# Patient Record
Sex: Female | Born: 1945 | Race: White | Hispanic: No | Marital: Married | Smoking: Never smoker
Health system: Southern US, Community
[De-identification: ages and names within clinical notes are randomized; demographics above are authoritative.]

## PROBLEM LIST (undated history)

## (undated) DIAGNOSIS — E785 Hyperlipidemia, unspecified: Secondary | ICD-10-CM

## (undated) DIAGNOSIS — M109 Gout, unspecified: Secondary | ICD-10-CM

## (undated) DIAGNOSIS — E119 Type 2 diabetes mellitus without complications: Secondary | ICD-10-CM

## (undated) DIAGNOSIS — I10 Essential (primary) hypertension: Secondary | ICD-10-CM

## (undated) HISTORY — PX: TUBAL LIGATION: SHX77

## (undated) HISTORY — DX: Gout, unspecified: M10.9

## (undated) HISTORY — DX: Type 2 diabetes mellitus without complications: E11.9

## (undated) HISTORY — PX: CATARACT EXTRACTION: SUR2

## (undated) HISTORY — DX: Essential (primary) hypertension: I10

## (undated) HISTORY — DX: Hyperlipidemia, unspecified: E78.5

---

## 1998-03-07 ENCOUNTER — Other Ambulatory Visit: Admission: RE | Admit: 1998-03-07 | Discharge: 1998-03-07 | Payer: Self-pay | Admitting: *Deleted

## 2000-01-22 ENCOUNTER — Other Ambulatory Visit: Admission: RE | Admit: 2000-01-22 | Discharge: 2000-01-22 | Payer: Self-pay | Admitting: Family Medicine

## 2002-11-25 ENCOUNTER — Encounter: Payer: Self-pay | Admitting: Family Medicine

## 2002-11-25 ENCOUNTER — Ambulatory Visit (HOSPITAL_COMMUNITY): Admission: RE | Admit: 2002-11-25 | Discharge: 2002-11-25 | Payer: Self-pay | Admitting: Family Medicine

## 2006-09-28 ENCOUNTER — Encounter: Admission: RE | Admit: 2006-09-28 | Discharge: 2006-09-28 | Payer: Self-pay | Admitting: Family Medicine

## 2009-02-13 ENCOUNTER — Encounter: Admission: RE | Admit: 2009-02-13 | Discharge: 2009-02-13 | Payer: Self-pay | Admitting: Family Medicine

## 2012-02-07 ENCOUNTER — Other Ambulatory Visit: Payer: Self-pay | Admitting: Family Medicine

## 2012-02-07 DIAGNOSIS — M79602 Pain in left arm: Secondary | ICD-10-CM

## 2012-02-07 DIAGNOSIS — M25512 Pain in left shoulder: Secondary | ICD-10-CM

## 2012-02-11 ENCOUNTER — Ambulatory Visit
Admission: RE | Admit: 2012-02-11 | Discharge: 2012-02-11 | Disposition: A | Discharge status: Home / Self Care | Payer: Medicare Other | Source: Ambulatory Visit | Attending: Family Medicine | Admitting: Family Medicine

## 2012-02-11 ENCOUNTER — Other Ambulatory Visit: Payer: Self-pay

## 2012-02-11 ENCOUNTER — Other Ambulatory Visit: Payer: Self-pay | Admitting: Family Medicine

## 2012-02-11 DIAGNOSIS — M25512 Pain in left shoulder: Secondary | ICD-10-CM

## 2012-02-11 DIAGNOSIS — M79602 Pain in left arm: Secondary | ICD-10-CM

## 2012-08-14 ENCOUNTER — Ambulatory Visit (HOSPITAL_COMMUNITY)
Admission: RE | Admit: 2012-08-14 | Discharge: 2012-08-14 | Disposition: A | Discharge status: Home / Self Care | Payer: Medicare PPO | Source: Ambulatory Visit | Attending: Family Medicine | Admitting: Family Medicine

## 2012-08-14 DIAGNOSIS — M79609 Pain in unspecified limb: Secondary | ICD-10-CM | POA: Insufficient documentation

## 2012-08-14 DIAGNOSIS — M25562 Pain in left knee: Secondary | ICD-10-CM

## 2012-08-16 ENCOUNTER — Other Ambulatory Visit (HOSPITAL_COMMUNITY): Payer: Self-pay | Admitting: Family Medicine

## 2012-08-16 DIAGNOSIS — M25562 Pain in left knee: Secondary | ICD-10-CM

## 2013-12-15 ENCOUNTER — Ambulatory Visit
Admission: RE | Admit: 2013-12-15 | Discharge: 2013-12-15 | Disposition: A | Discharge status: Home / Self Care | Payer: Medicare PPO | Source: Ambulatory Visit | Attending: Family Medicine | Admitting: Family Medicine

## 2013-12-15 ENCOUNTER — Other Ambulatory Visit: Payer: Self-pay | Admitting: Family Medicine

## 2013-12-15 DIAGNOSIS — R52 Pain, unspecified: Secondary | ICD-10-CM

## 2014-12-22 ENCOUNTER — Other Ambulatory Visit: Payer: Self-pay | Admitting: Family Medicine

## 2014-12-22 DIAGNOSIS — M5136 Other intervertebral disc degeneration, lumbar region: Secondary | ICD-10-CM

## 2015-01-10 ENCOUNTER — Other Ambulatory Visit: Payer: Medicare PPO

## 2015-01-17 ENCOUNTER — Ambulatory Visit
Admission: RE | Admit: 2015-01-17 | Discharge: 2015-01-17 | Disposition: A | Discharge status: Home / Self Care | Payer: Medicare PPO | Source: Ambulatory Visit | Attending: Family Medicine | Admitting: Family Medicine

## 2015-01-17 DIAGNOSIS — M5136 Other intervertebral disc degeneration, lumbar region: Secondary | ICD-10-CM

## 2020-06-20 ENCOUNTER — Other Ambulatory Visit: Payer: Self-pay | Admitting: Neurosurgery

## 2020-06-20 DIAGNOSIS — M4807 Spinal stenosis, lumbosacral region: Secondary | ICD-10-CM

## 2020-07-13 ENCOUNTER — Ambulatory Visit
Admission: RE | Admit: 2020-07-13 | Discharge: 2020-07-13 | Disposition: A | Discharge status: Home / Self Care | Payer: Medicare PPO | Source: Ambulatory Visit | Attending: Neurosurgery | Admitting: Neurosurgery

## 2020-07-13 ENCOUNTER — Other Ambulatory Visit: Payer: Self-pay

## 2020-07-13 DIAGNOSIS — M4807 Spinal stenosis, lumbosacral region: Secondary | ICD-10-CM

## 2020-08-14 ENCOUNTER — Other Ambulatory Visit: Payer: Self-pay | Admitting: Neurosurgery

## 2020-08-14 DIAGNOSIS — M4804 Spinal stenosis, thoracic region: Secondary | ICD-10-CM

## 2020-09-21 ENCOUNTER — Other Ambulatory Visit: Payer: Medicare PPO

## 2020-10-19 ENCOUNTER — Other Ambulatory Visit: Payer: Self-pay

## 2020-10-19 ENCOUNTER — Ambulatory Visit
Admission: RE | Admit: 2020-10-19 | Discharge: 2020-10-19 | Disposition: A | Discharge status: Home / Self Care | Payer: Medicare PPO | Source: Ambulatory Visit | Attending: Neurosurgery | Admitting: Neurosurgery

## 2020-10-19 DIAGNOSIS — M4804 Spinal stenosis, thoracic region: Secondary | ICD-10-CM

## 2021-06-18 IMAGING — MR MR LUMBAR SPINE W/O CM
4 of 5 series · 18 of 48 positions shown · non-contrast
Comparison: 01/17/2015

CLINICAL DATA: Low back pain for many years which is worsening.

EXAM:
MRI LUMBAR SPINE WITHOUT CONTRAST
TECHNIQUE: Multiplanar, multisequence MR imaging of the lumbar spine was
performed. No intravenous contrast was administered.

[Series 5: T2 · sagittal · 4.0mm · 0.73mm/px · 6 of 15 slices shown (1 of 2)]
[im 1/15]
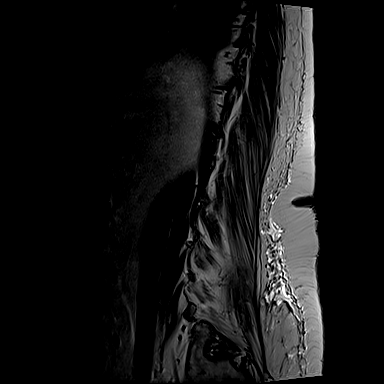
[im 3/15]
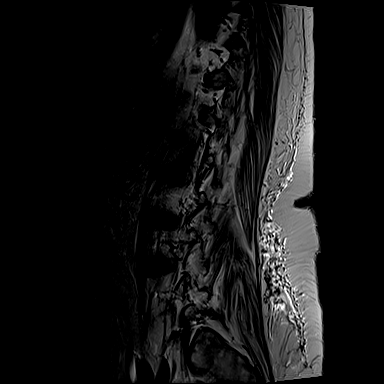
[im 6/15]
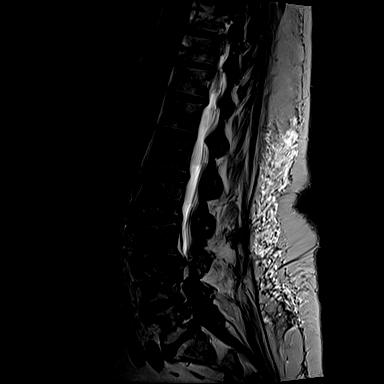
[im 9/15]
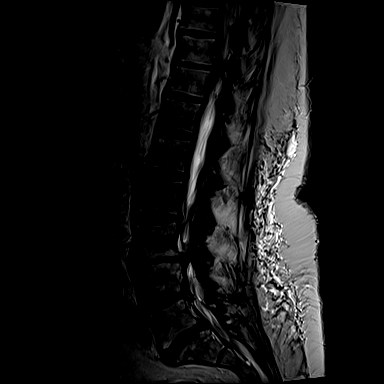
[im 12/15]
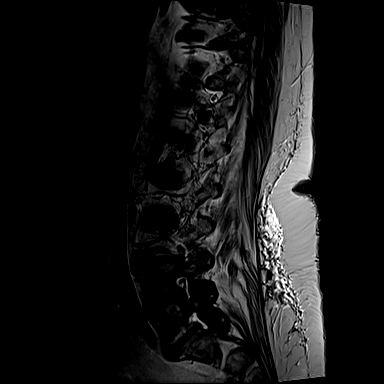
[im 15/15]
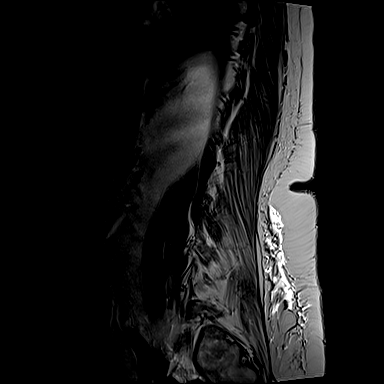

[Series 6: T1 · sagittal · 4.0mm · 0.88mm/px · 3 of 15 slices shown (1 of 2)]
[im 3/15]
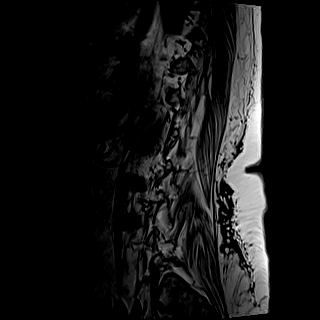
[im 9/15]
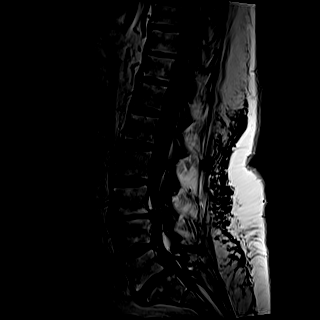
[im 15/15]
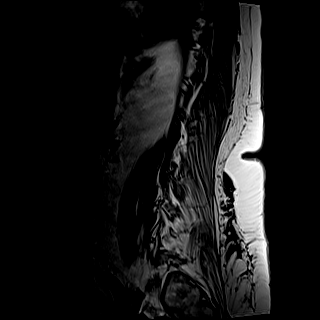

[Series 10: T1 · axial · 4.0mm · 0.28mm/px · z∈[-41,+89]mm · 3 of 39 slices shown (2 of 2)]
[im 6/39]
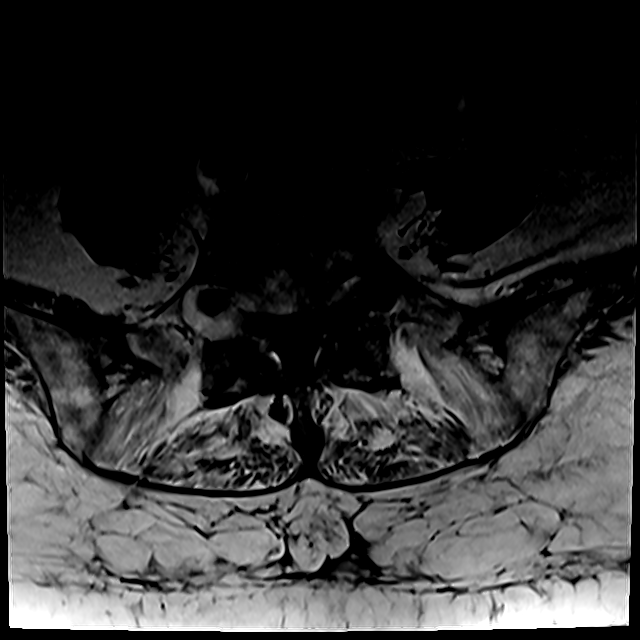
[im 20/39]
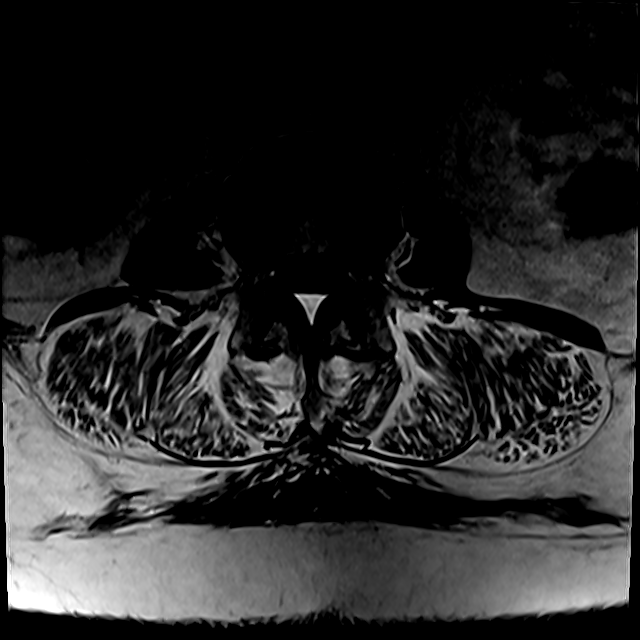
[im 33/39]
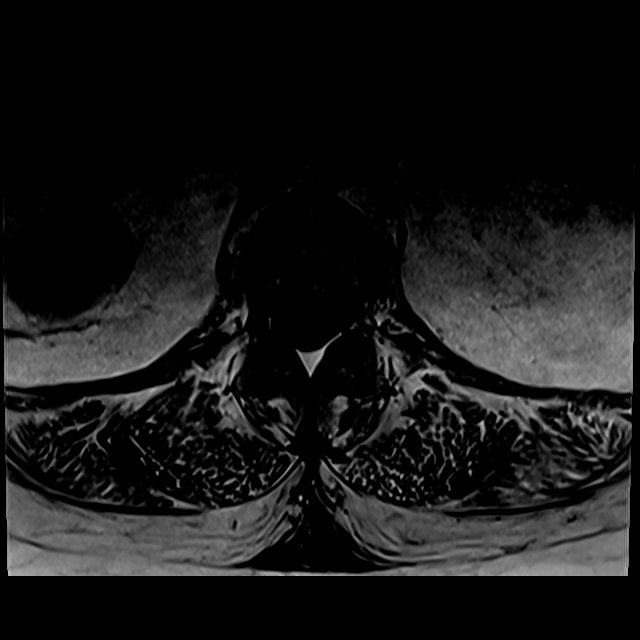

[Series 13: T2 · axial · 4.0mm · 0.28mm/px · z∈[-66,+89]mm · 6 of 39 slices shown (2 of 2)]
[im 1/39]
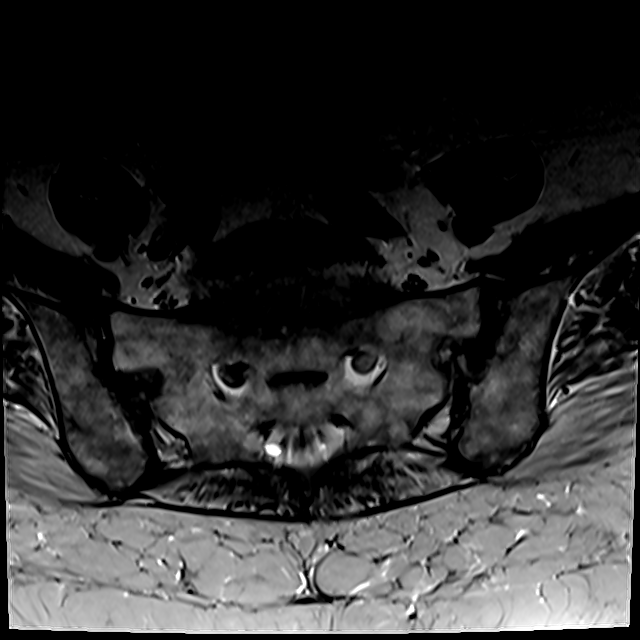
[im 6/39]
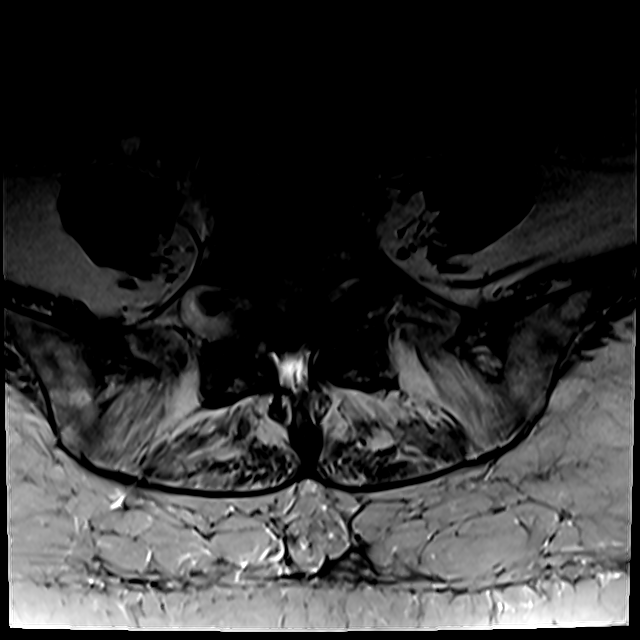
[im 11/39]
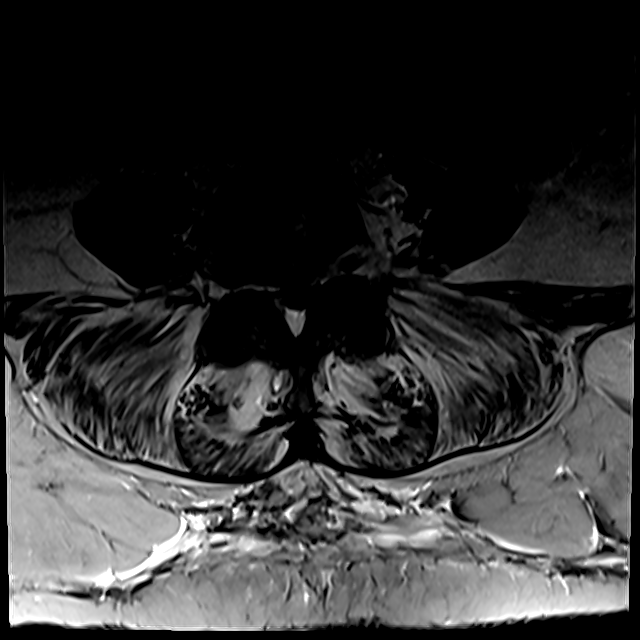
[im 17/39]
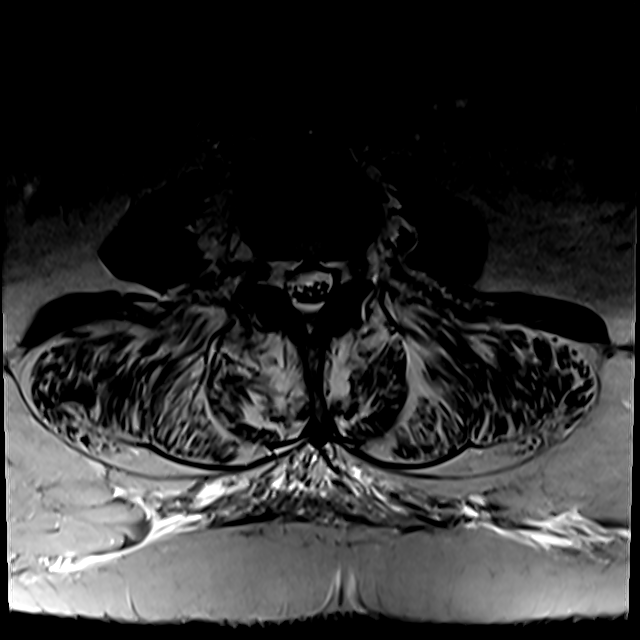
[im 20/39]
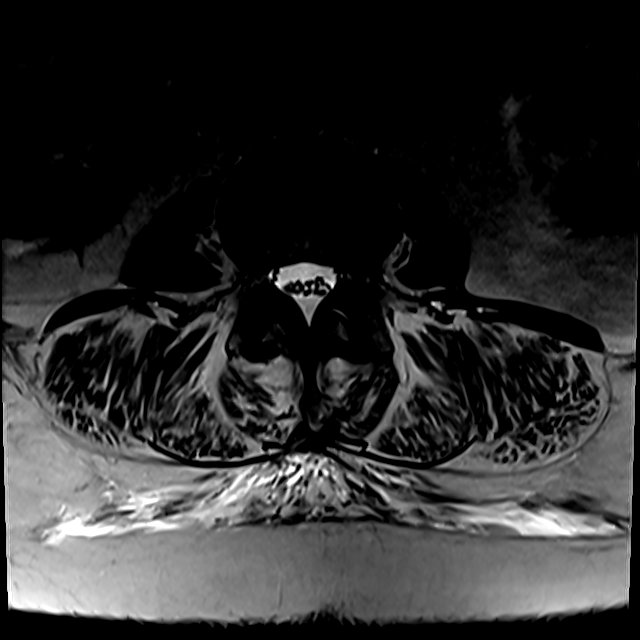
[im 33/39]
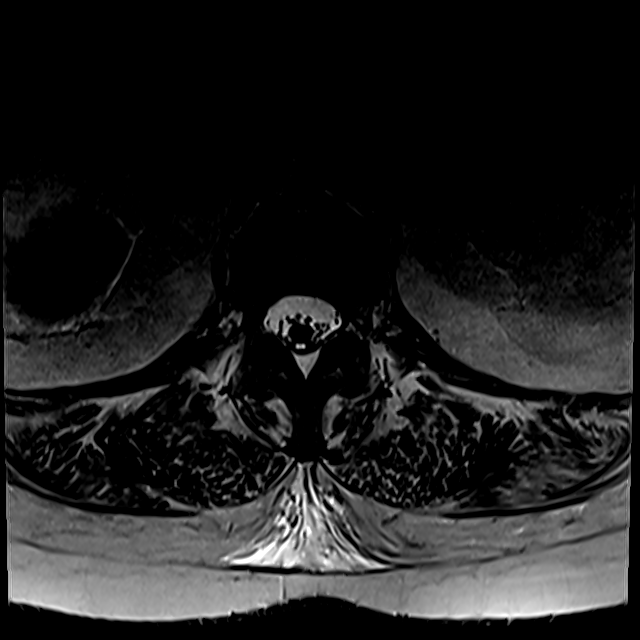

[18 of 48 positions shown; findings below may reference images not displayed]

FINDINGS: Segmentation:  5 lumbar type vertebral bodies.

Alignment:  No malalignment.

Vertebrae:  No fracture or primary bone lesion.

Conus medullaris and cauda equina: Conus extends to the L1 level.
Conus and cauda equina appear normal.

Paraspinal and other soft tissues: Negative except for fatty atrophy
of the para spinous musculature.

Disc levels:

T9-10: Not studied in the axial plane. Mild bulging of the disc.
Facet and ligamentous hypertrophy. Some canal narrowing but no
apparent compressive effect upon the cord.

T10-11: Not studied in the axial plane. Shallow protrusion of the
disc. Pronounced facet and ligamentous hypertrophy. Stenosis of the
canal with some deformity of the cord. Question abnormal T2 signal
in the cord at this level. Findings could possibly relate to lower
thoracic myelopathy. Consider complete thoracic spine evaluation.

T11-12: Not studied in the axial plane. Bulging of the disc. Mild
facet and ligamentous hypertrophy. No compressive stenosis.

T12-L1: Minimal disc bulge. Mild facet and ligamentous hypertrophy.
No compressive stenosis.

L1-2: Mild bulging of the disc. Mild facet hypertrophy. No
compressive stenosis.

L2-3: Mild bulging of the disc. Mild facet hypertrophy. No
compressive stenosis.

L3-4: Chronic disc herniation more prominent towards the right.
Associated endplate osteophytes. Facet and ligamentous hypertrophy.
Multifactorial spinal stenosis, worse on the right than the left.
Neural compression could occur at this level. Findings have worsened
slightly since 0784.

L4-5: Chronic broad-based disc herniation. Associated endplate
osteophytes. Facet and ligamentous hypertrophy. Severe
multifactorial stenosis. Similar appearance to the study of 0784.

L5-S1: Shallow protrusion of the disc. Facet and ligamentous
hypertrophy. No central canal stenosis. No S1 nerve compression is
seen. Left foraminal narrowing that could possibly affect the
exiting L5 nerve. Similar appearance to the study of 0784.
IMPRESSION: 1. T10-11: Not studied in the axial plane. Shallow protrusion of the
disc. Pronounced facet and ligamentous hypertrophy. Spinal stenosis
with some deformity of the cord. Question abnormal T2 signal in the
cord at this level. Findings could possibly relate to lower thoracic
myelopathy. Consider complete thoracic spine evaluation.
2. L3-4: Multifactorial spinal stenosis, worse on the right than the
left. Neural compression could occur at this level, particularly on
the right. Findings have worsened slightly since [DATE]. L4-5: Severe multifactorial stenosis unchanged since [DATE]. L5-S1: Left foraminal stenosis because of osteophyte and bulging
disc material that could possibly affect the exiting L5 nerve,
similar to the study of 0784.

## 2022-07-29 ENCOUNTER — Ambulatory Visit: Payer: Medicare PPO | Admitting: Cardiology

## 2022-08-04 ENCOUNTER — Ambulatory Visit: Payer: Medicare PPO | Admitting: Cardiology

## 2022-08-07 ENCOUNTER — Encounter: Payer: Self-pay | Admitting: Cardiology

## 2022-08-07 ENCOUNTER — Ambulatory Visit: Payer: Medicare PPO | Admitting: Cardiology

## 2022-08-10 NOTE — Progress Notes (Deleted)
    Patient referred by Leonard Downing, * for ***  Subjective:   Carolyn Irwin, female    DOB: October 28, 1945, 77 y.o.   MRN: [id number]  *** No chief complaint on file.   *** HPI  77 y.o.  female with PMH significant for but not limited to Diabetes Mellitus, HTN, and hyperlipidemia who presents today to establish care and evaluation of SVT/Afib.   Pt was sent for evaluation from PCP after obtaining event monitor results (as below).  *** No past medical history on file.  *** *** The histories are not reviewed yet. Please review them in the "History" navigator section and refresh this SmartLink.  *** Social History   Tobacco Use  Smoking Status Not on file  Smokeless Tobacco Not on file    Social History   Substance and Sexual Activity  Alcohol Use Not on file    *** No family history on file.  *** No current outpatient medications on file.   Cardiovascular and other pertinent studies:  07/24/2022-07/30/2022   Reviewed external labs and tests, independently interpreted  *** EKG ***/***/202***: ***  ***  *** Recent labs: 07/24/2022: Glucose 124, BUN/Cr 16/1.22. EGFR 45. Na/K 141/4.2.  H/H 13.6/39.8. MCV 89. Platelets 209 ***HbA1C ***% Chol ***, TG ***, HDL ***, LDL *** TSH 1.660 normal Mag: 1.6  *** ROS      *** There were no vitals filed for this visit.   There is no height or weight on file to calculate BMI. There were no vitals filed for this visit.  *** Objective:   Physical Exam   Afib/SVT    CHA2DS2-VASc Score =  5 The patient's score is based upon: Age greater than or equal to 41 (2), Female (1), HTN hx (1), DM Hx (1)           Visit diagnoses: No diagnosis found.   No orders of the defined types were placed in this encounter.    Medication changes this visit: There are no discontinued medications.  No orders of the defined types were placed in this encounter.    Assessment & Recommendations:    ***  ***  Thank you for referring the patient to [place]. Please feel free to contact with any questions.   Nigel Mormon, MD Pager: [phone] Office: [phone]

## 2022-08-11 ENCOUNTER — Ambulatory Visit: Payer: Medicare PPO | Admitting: Cardiology

## 2022-08-12 ENCOUNTER — Ambulatory Visit: Payer: Medicare PPO | Admitting: Cardiology

## 2022-08-14 ENCOUNTER — Encounter: Payer: Self-pay | Admitting: Cardiology

## 2022-08-14 ENCOUNTER — Ambulatory Visit: Payer: Medicare PPO | Admitting: Cardiology

## 2022-08-14 VITALS — BP 129/67 | HR 95 | Resp 18 | Ht 62.0 in | Wt 228.2 lb

## 2022-08-14 DIAGNOSIS — I10 Essential (primary) hypertension: Secondary | ICD-10-CM

## 2022-08-14 DIAGNOSIS — I480 Paroxysmal atrial fibrillation: Secondary | ICD-10-CM

## 2022-08-14 DIAGNOSIS — E119 Type 2 diabetes mellitus without complications: Secondary | ICD-10-CM

## 2022-08-14 DIAGNOSIS — E782 Mixed hyperlipidemia: Secondary | ICD-10-CM

## 2022-08-14 DIAGNOSIS — R0609 Other forms of dyspnea: Secondary | ICD-10-CM

## 2022-08-14 DIAGNOSIS — Z7901 Long term (current) use of anticoagulants: Secondary | ICD-10-CM

## 2022-08-14 MED ORDER — APIXABAN 5 MG PO TABS: 5.0000 mg | ORAL_TABLET | Freq: Two times a day (BID) | ORAL | 0 refills | Status: AC

## 2022-08-14 NOTE — Progress Notes (Signed)
ID:  Carolyn Irwin, DOB September 02, 1945, MRN [id number]  PCP:  Leonard Downing, MD  Cardiologist:  Rex Kras, DO, [facility] (established care 08/14/2022) Former Cardiology Providers: none  REASON FOR CONSULT: Evaluation of SVT/Afib   REQUESTING PHYSICIAN:  Leonard Downing, MD [address] [place],  [place] [phone]  Chief Complaint  Patient presents with   SVT   Atrial Fibrillation   New Patient (Initial Visit)    HPI  Carolyn Irwin is a 77 y.o.  female who presents to the clinic for evaluation of SVT/Afib at the request of Leonard Downing, *. Her past medical history and cardiovascular risk factors include: hypertension, hyperlipidemia, and diabetes mellitus  Patient was seen by PCP for dizziness and lightheadedness that was occurring for three days. She was placed on an event monitor.  Monitor results were concerning for atrial fibrillation and she referred to cardiology for further evaluation and management.  She is accompanied by both her daughter and husband at today's office visit.   No prior history of atrial fibrillation.  No prior history of she does ambulate with a cane to prevent falls.    Denies anginal chest pain or heart failure symptoms.  However, review of systems are positive for shortness of breath, predominantly with effort related activities, ongoing for the last several years, no change in intensity/frequency/duration.  She was started on vertigo medications given her lightheaded and dizziness which has helped her symptoms overall.  FUNCTIONAL STATUS: No structured exercise program or daily routine.  ALLERGIES: Not on File  MEDICATION LIST PRIOR TO VISIT: Current Meds  Medication Sig   allopurinol (ZYLOPRIM) 100 MG tablet Take 100 mg by mouth daily.   apixaban (ELIQUIS) 5 MG TABS tablet Take 1 tablet (5 mg total) by mouth 2 (two) times daily.   atorvastatin (LIPITOR) 80 MG tablet Take 80 mg by mouth daily.   Calcium Acetate, Phos  Binder, (CALCIUM ACETATE PO) Take by mouth.   diltiazem (CARDIZEM CD) 120 MG 24 hr capsule Take 120 mg by mouth daily.   furosemide (LASIX) 40 MG tablet Take 40 mg by mouth daily.   ipratropium (ATROVENT) 0.06 % nasal spray Place 1 spray into both nostrils as needed for rhinitis.   loratadine (CLARITIN) 10 MG tablet Take 10 mg by mouth daily as needed.   losartan (COZAAR) 100 MG tablet Take 50 mg by mouth daily.   meclizine (ANTIVERT) 25 MG tablet Take 25 mg by mouth daily.   Melatonin 1 MG CAPS Take by mouth.   metFORMIN (GLUCOPHAGE) 1000 MG tablet Take 1,000 mg by mouth 2 (two) times daily with a meal.   Multiple Vitamins-Minerals (MULTIVITAMIN WITH MINERALS) tablet Take 1 tablet by mouth daily.     PAST MEDICAL HISTORY: Past Medical History:  Diagnosis Date   Diabetes mellitus without complication ([place])    Gout    Hyperlipidemia    Hypertension     PAST SURGICAL HISTORY: Past Surgical History:  Procedure Laterality Date   CATARACT EXTRACTION Bilateral    TUBAL LIGATION Bilateral     FAMILY HISTORY: The patient family history includes Parkinson's disease in her sister; Prostate cancer in her father.  SOCIAL HISTORY:  The patient  reports that she has never smoked. She has never used smokeless tobacco. She reports that she does not drink alcohol and does not use drugs.  REVIEW OF SYSTEMS: Review of Systems  Cardiovascular:  Positive for dyspnea on exertion. Negative for chest pain, claudication, irregular heartbeat, leg  swelling, near-syncope, orthopnea, palpitations, paroxysmal nocturnal dyspnea and syncope.  Respiratory:  Positive for shortness of breath.   Hematologic/Lymphatic: Negative for bleeding problem.  Musculoskeletal:  Negative for muscle cramps and myalgias.  Neurological:  Positive for dizziness (Intermittent, improving) and light-headedness (Intermittent, improving).    PHYSICAL EXAM:    08/14/2022    8:52 AM  Vitals with BMI  Height 5\' 2"   Weight 228  lbs 3 oz  BMI [id number]  Systolic [id number]  Diastolic 67  Pulse 95    Physical Exam  Constitutional: No distress.  Age appropriate, hemodynamically stable, ambulates with a cane  Neck: No JVD present.  Cardiovascular: Normal rate, regular rhythm, S1 normal, S2 normal, intact distal pulses and normal pulses. Exam reveals no gallop, no S3 and no S4.  No murmur heard. Pulmonary/Chest: Effort normal and breath sounds normal. No stridor. She has no wheezes. She has no rales.  Abdominal: Soft. Bowel sounds are normal. She exhibits no distension. There is no abdominal tenderness.  Abdominal obesity  Musculoskeletal:        General: No edema.     Cervical back: Neck supple.  Neurological: She is alert and oriented to person, place, and time. She has intact cranial nerves (2-12).  Skin: Skin is warm and dry.  Denies skin, poor nail hygiene   CARDIAC DATABASE: EKG: 08/14/2022: Sinus rhythm, 85 bpm, LAE, consider old inferior infarct, without underlying injury pattern.  Echocardiogram: No results found for this or any previous visit from the past 1095 days.    Stress Testing: No results found for this or any previous visit from the past 1095 days.   Heart Catheterization: None  Cardiac Monitor  07/04/2022 - 07/28/2022 Will get entire report from PCP office  Day summaries provided notes paroxysmal Afib.    LABORATORY DATA:     No data to display              No data to display          Lipid Panel  No results found for: "CHOL", "TRIG", "HDL", "CHOLHDL", "VLDL", "LDLCALC", "LDLDIRECT", "LABVLDL"  No components found for: "NTPROBNP" No results for input(s): "PROBNP" in the last 8760 hours.  External Labs 07/24/2022 Glucose 124, BUN/Cr 16/1.22. EGFR 45. Na/K 141/4.2. Rest of the CMP normal H/H 13.6/39.8. MCV 89. Platelets 209 Mag 1.6 TSH 1.66  HEMOGLOBIN A1C No results found for: "HGBA1C", "MPG"  IMPRESSION:    ICD-10-CM   1. PAF (paroxysmal atrial fibrillation)  (HCC)  I48.0 EKG 12-Lead    apixaban (ELIQUIS) 5 MG TABS tablet    Basic Metabolic Panel (BMET)    Hemoglobin and hematocrit, blood    PCV ECHOCARDIOGRAM COMPLETE    Hemoglobin and hematocrit, blood    Basic Metabolic Panel (BMET)    2. Long term (current) use of anticoagulants  Z79.01     3. DOE (dyspnea on exertion)  R06.09 PCV ECHOCARDIOGRAM COMPLETE    4. Benign hypertension  I10     5. Mixed hyperlipidemia  E78.2     6. Non-insulin dependent type 2 diabetes mellitus ([place])  E11.9     7. Class 3 severe obesity due to excess calories with serious comorbidity and body mass index (BMI) of 40.0 to 44.9 in adult [facility])  E66.01    Z68.41        RECOMMENDATIONS: Carolyn Irwin is a 77 y.o.  female whose past medical history and cardiac risk factors include: hypertension, hyperlipidemia, and diabetes mellitus  PAF (paroxysmal atrial fibrillation) (  [place]) Long-term anticoagulation Personally reviewed the fragmented report monitor provided by PCP which notes evidence of PAF Will obtain an entire report she is more legible to read.  Based on the data provided her overall PAF burden appears to be low but still present.  EKG personally reviewed, NSR today  CHA2DS2-VASc Score =   5 -  Age (2), HTN (1), DM (1), Female (1) Rate: Diltiazem Rhythm: None Anti-coagulation:Starting Eliquis 5 mg BID Repeat H/H and BMP in 6 months No prior history of intracranial bleeding or gastrointestinal bleeding. Discussed the risks, benefits, alternatives to anticoagulation with the patient, husband, and daughter.   Dyspnea on Exertion  Chronic-ongoing for years. Predominantly brought on by effort related activities. Appears euvolemic on physical examination. Home blood pressures appear to be well-controlled on current medical therapy. Echo will be ordered to evaluate for structural heart disease and left ventricular systolic function. Will discuss ischemic workup at the next visit.  Benign essential  hypertension:  Office blood pressures are well-controlled. Medications reconciled. No changes warranted.  Diabetes mellitus type 2, non-insulin-dependent. Currently on atorvastatin, metformin. Currently managed by primary care provider.  Mixed hyperlipidemia: Currently on statin therapy.  Reemphasized importance of lipid management.    FINAL MEDICATION LIST END OF ENCOUNTER: Meds ordered this encounter  Medications   apixaban (ELIQUIS) 5 MG TABS tablet    Sig: Take 1 tablet (5 mg total) by mouth 2 (two) times daily.    Dispense:  180 tablet    Refill:  0    There are no discontinued medications.   Current Outpatient Medications:    allopurinol (ZYLOPRIM) 100 MG tablet, Take 100 mg by mouth daily., Disp: , Rfl:    apixaban (ELIQUIS) 5 MG TABS tablet, Take 1 tablet (5 mg total) by mouth 2 (two) times daily., Disp: 180 tablet, Rfl: 0   atorvastatin (LIPITOR) 80 MG tablet, Take 80 mg by mouth daily., Disp: , Rfl:    Calcium Acetate, Phos Binder, (CALCIUM ACETATE PO), Take by mouth., Disp: , Rfl:    diltiazem (CARDIZEM CD) 120 MG 24 hr capsule, Take 120 mg by mouth daily., Disp: , Rfl:    furosemide (LASIX) 40 MG tablet, Take 40 mg by mouth daily., Disp: , Rfl:    ipratropium (ATROVENT) 0.06 % nasal spray, Place 1 spray into both nostrils as needed for rhinitis., Disp: , Rfl:    loratadine (CLARITIN) 10 MG tablet, Take 10 mg by mouth daily as needed., Disp: , Rfl:    losartan (COZAAR) 100 MG tablet, Take 50 mg by mouth daily., Disp: , Rfl:    meclizine (ANTIVERT) 25 MG tablet, Take 25 mg by mouth daily., Disp: , Rfl:    Melatonin 1 MG CAPS, Take by mouth., Disp: , Rfl:    metFORMIN (GLUCOPHAGE) 1000 MG tablet, Take 1,000 mg by mouth 2 (two) times daily with a meal., Disp: , Rfl:    Multiple Vitamins-Minerals (MULTIVITAMIN WITH MINERALS) tablet, Take 1 tablet by mouth daily., Disp: , Rfl:   Orders Placed This Encounter  Procedures   Basic Metabolic Panel (BMET)   Hemoglobin and  hematocrit, blood   EKG 12-Lead   PCV ECHOCARDIOGRAM COMPLETE    There are no Patient Instructions on file for this visit.   --Continue cardiac medications as reconciled in final medication list. --Return in about 6 weeks (around 09/25/2022) for Follow up, A. fib, Review test results. or sooner if needed. --Continue follow-up with your primary care physician regarding the management of your other chronic comorbid  conditions.  Patient's questions and concerns were addressed to her satisfaction. She voices understanding of the instructions provided during this encounter.   This note was created using a voice recognition software as a result there may be grammatical errors inadvertently enclosed that do not reflect the nature of this encounter. Every attempt is made to correct such errors.  Rex Kras, [place], [facility]  Pager:  [phone] Office: [phone]

## 2022-08-27 ENCOUNTER — Ambulatory Visit: Payer: Medicare PPO

## 2022-08-27 DIAGNOSIS — I480 Paroxysmal atrial fibrillation: Secondary | ICD-10-CM

## 2022-08-27 DIAGNOSIS — R0609 Other forms of dyspnea: Secondary | ICD-10-CM

## 2022-09-09 NOTE — Progress Notes (Signed)
Called patient to inform her about her echo patient understood

## 2022-09-29 ENCOUNTER — Encounter: Payer: Self-pay | Admitting: Cardiology

## 2022-09-29 ENCOUNTER — Ambulatory Visit: Payer: Medicare PPO | Admitting: Cardiology

## 2022-09-29 VITALS — BP 144/71 | HR 97 | Resp 20 | Ht 62.0 in | Wt 223.6 lb

## 2022-09-29 DIAGNOSIS — R0609 Other forms of dyspnea: Secondary | ICD-10-CM

## 2022-09-29 DIAGNOSIS — E782 Mixed hyperlipidemia: Secondary | ICD-10-CM

## 2022-09-29 DIAGNOSIS — I10 Essential (primary) hypertension: Secondary | ICD-10-CM

## 2022-09-29 DIAGNOSIS — I480 Paroxysmal atrial fibrillation: Secondary | ICD-10-CM

## 2022-09-29 DIAGNOSIS — E66813 Obesity, class 3: Secondary | ICD-10-CM

## 2022-09-29 DIAGNOSIS — E119 Type 2 diabetes mellitus without complications: Secondary | ICD-10-CM

## 2022-09-29 DIAGNOSIS — Z7901 Long term (current) use of anticoagulants: Secondary | ICD-10-CM

## 2022-09-29 NOTE — Progress Notes (Signed)
ID:  Carolyn Irwin, DOB 07-25-1945, MRN [id number]  PCP:  Kaleen Mask, MD  Cardiologist:  Tessa Lerner, DO, [facility] (established care 08/14/2022) Former Cardiology Providers: none  Date: 09/29/22 Last Office Visit: 08/14/2022  Chief Complaint  Patient presents with   Atrial Fibrillation   Follow-up    HPI  Carolyn Irwin is a 77 y.o.  female whose past medical history and cardiovascular risk factors include: Paroxysmal atrial fibrillation, hypertension, hyperlipidemia, and diabetes mellitus  Patient was referred to the practice in March 2024 for evaluation and management of paroxysmal atrial fibrillation.  In the past she was being seen by her PCP for dizziness and lightheadedness and had a cardiac monitor which noted paroxysmal atrial fibrillation.  She was also started on vertigo medications which improved her lightheaded and dizziness.  At the last office visit given her elevated CHA2DS2-VASc score with a diagnosis of PAF she was started on Eliquis for thromboembolic prophylaxis.  She now presents today for follow-up.  Results of the echocardiogram reviewed with her and noted below for further reference.  She is tolerating anticoagulation well without any side effects or intolerances.  She denies anginal chest pain or heart failure symptoms.  Was able to obtain the official [employer] report from PCP results discussed with the patient and daughter at today's office visit.  FUNCTIONAL STATUS: No structured exercise program or daily routine.  ALLERGIES: Not on File  MEDICATION LIST PRIOR TO VISIT: Current Meds  Medication Sig   allopurinol (ZYLOPRIM) 100 MG tablet Take 100 mg by mouth daily.   apixaban (ELIQUIS) 5 MG TABS tablet Take 1 tablet (5 mg total) by mouth 2 (two) times daily.   atorvastatin (LIPITOR) 80 MG tablet Take 80 mg by mouth daily.   Calcium Acetate, Phos Binder, (CALCIUM ACETATE PO) Take by mouth.   diltiazem (CARDIZEM CD) 120 MG 24 hr capsule Take 120  mg by mouth daily.   furosemide (LASIX) 40 MG tablet Take 40 mg by mouth daily.   ipratropium (ATROVENT) 0.06 % nasal spray Place 1 spray into both nostrils as needed for rhinitis.   loratadine (CLARITIN) 10 MG tablet Take 10 mg by mouth daily as needed.   losartan (COZAAR) 100 MG tablet Take 50 mg by mouth daily.   meclizine (ANTIVERT) 25 MG tablet Take 25 mg by mouth daily.   metFORMIN (GLUCOPHAGE) 1000 MG tablet Take 1,000 mg by mouth 2 (two) times daily with a meal.   Multiple Vitamins-Minerals (MULTIVITAMIN WITH MINERALS) tablet Take 1 tablet by mouth daily.     PAST MEDICAL HISTORY: Past Medical History:  Diagnosis Date   Diabetes mellitus without complication (HCC)    Gout    Hyperlipidemia    Hypertension     PAST SURGICAL HISTORY: Past Surgical History:  Procedure Laterality Date   CATARACT EXTRACTION Bilateral    TUBAL LIGATION Bilateral     FAMILY HISTORY: The patient family history includes Parkinson's disease in her sister; Prostate cancer in her father.  SOCIAL HISTORY:  The patient  reports that she has never smoked. She has never used smokeless tobacco. She reports that she does not drink alcohol and does not use drugs.  REVIEW OF SYSTEMS: Review of Systems  Cardiovascular:  Positive for dyspnea on exertion. Negative for chest pain, claudication, irregular heartbeat, leg swelling, near-syncope, orthopnea, palpitations, paroxysmal nocturnal dyspnea and syncope.  Respiratory:  Positive for shortness of breath.   Hematologic/Lymphatic: Negative for bleeding problem.  Musculoskeletal:  Negative for muscle cramps and  myalgias.  Neurological:  Negative for dizziness and light-headedness.    PHYSICAL EXAM:    09/29/2022    2:36 PM 08/14/2022    8:52 AM  Vitals with BMI  Height 5\' 2"  5\' 2"   Weight 223 lbs 10 oz 228 lbs 3 oz  BMI 40.89 41.73  Systolic 144 129  Diastolic 71 67  Pulse 97 95    Physical Exam  Constitutional: No distress.  Age appropriate,  hemodynamically stable, ambulates with a cane  Neck: No JVD present.  Cardiovascular: Normal rate, regular rhythm, S1 normal, S2 normal, intact distal pulses and normal pulses. Exam reveals no gallop, no S3 and no S4.  No murmur heard. Pulmonary/Chest: Effort normal and breath sounds normal. No stridor. She has no wheezes. She has no rales.  Abdominal: Soft. Bowel sounds are normal. She exhibits no distension. There is no abdominal tenderness.  Abdominal obesity  Musculoskeletal:        General: No edema.     Cervical back: Neck supple.  Neurological: She is alert and oriented to person, place, and time. She has intact cranial nerves (2-12).  Skin: Skin is warm and dry.  Denies skin, poor nail hygiene   CARDIAC DATABASE: EKG: Sep 29, 2022: Sinus rhythm, 86 bpm, left atrial enlargement, poor R wave progression, without underlying injury pattern.  Echocardiogram: 08/07/2022:  Normal LV systolic function with visual EF 55-60%. Left ventricle cavity is normal in size. Normal left ventricular wall thickness. Normal global wall motion. Normal diastolic filling pattern, normal LAP. Calculated EF 57%. Structurally normal tricuspid valve with trace regurgitation. No evidence of pulmonary hypertension. No prior available for comparison.     Stress Testing: No results found for this or any previous visit from the past 1095 days.   Heart Catheterization: None  Cardiac Monitor  07/04/2022 - 07/28/2022 Will get entire report from PCP office  Day summaries provided notes paroxysmal Afib.    LABORATORY DATA: External Labs 07/24/2022 Glucose 124, BUN/Cr 16/1.22. EGFR 45. Na/K 141/4.2. Rest of the CMP normal H/H 13.6/39.8. MCV 89. Platelets 209 Mag 1.6 TSH 1.66  IMPRESSION:    ICD-10-CM   1. PAF (paroxysmal atrial fibrillation) (HCC)  I48.0 EKG 12-Lead    PCV MYOCARDIAL PERFUSION WITH LEXISCAN    2. Long term (current) use of anticoagulants  Z79.01     3. DOE (dyspnea on exertion)   R06.09 PCV MYOCARDIAL PERFUSION WITH LEXISCAN    4. Benign hypertension  I10     5. Mixed hyperlipidemia  E78.2     6. Non-insulin dependent type 2 diabetes mellitus (HCC)  E11.9     7. Class 3 severe obesity due to excess calories with serious comorbidity and body mass index (BMI) of 40.0 to 44.9 in adult [facility])  E66.01    Z68.41        RECOMMENDATIONS: Carolyn Irwin is a 77 y.o.  female whose past medical history and cardiac risk factors include: hypertension, hyperlipidemia, and diabetes mellitus  PAF (paroxysmal atrial fibrillation) (HCC) Long-term anticoagulation Based on the [employer] report the 7-day monitor illustrated her A-fib burden of approximately 1% (total time 30 minutes). Currently on diltiazem for rate control strategy and Eliquis for thromboembolic prophylaxis. CHA2DS2-VASc score 5 (age, hypertension, diabetes, gender).  Given the fact that A-fib burden was 30 minutes over the 7 days of monitoring we discussed the role of loop recorder implant for long-term monitoring of A-fib burden. Risks, benefits, and alternatives for loop implanted discussed at today's office visit.  Option  1: Continue current medical therapy with anticoagulation to reduce the risk of thromboembolic events. Option 2: Consider loop implant and continue anticoagulation for the first 90 days and as long as no significant A-fib burden we will transition to aspirin.  If and when the loop recorder documents further episodes of atrial fibrillation anticoagulation could be reconsidered at that time. Patient and daughter would like to continue current medical therapy and may consider loop implant as an alternative.  She will call the office back if she is willing to proceed forward otherwise will be discussed at the next visit.  Dyspnea on Exertion  Chronic-ongoing for years. Predominantly brought on by effort related activities. Appears euvolemic on physical examination. Home blood pressures appear to  be well-controlled on current medical therapy. Echo results reviewed with both the patient and daughter at today's visit. Due to ongoing dyspnea on exertion with activities of daily living and taking care of her husband and multiple cardiovascular risk factors shared decision was to proceed with pharmacological stress test.  Patient is unable to exercise but EKG is interpretable.  Benign essential hypertension:  Home blood pressures are better controlled. Medications reconciled. No changes warranted.  Diabetes mellitus type 2, non-insulin-dependent. Currently on atorvastatin, metformin. Currently managed by primary care provider.  Mixed hyperlipidemia: Currently on statin therapy.  Reemphasized importance of lipid management.    FINAL MEDICATION LIST END OF ENCOUNTER: No orders of the defined types were placed in this encounter.   There are no discontinued medications.   Current Outpatient Medications:    allopurinol (ZYLOPRIM) 100 MG tablet, Take 100 mg by mouth daily., Disp: , Rfl:    apixaban (ELIQUIS) 5 MG TABS tablet, Take 1 tablet (5 mg total) by mouth 2 (two) times daily., Disp: 180 tablet, Rfl: 0   atorvastatin (LIPITOR) 80 MG tablet, Take 80 mg by mouth daily., Disp: , Rfl:    Calcium Acetate, Phos Binder, (CALCIUM ACETATE PO), Take by mouth., Disp: , Rfl:    diltiazem (CARDIZEM CD) 120 MG 24 hr capsule, Take 120 mg by mouth daily., Disp: , Rfl:    furosemide (LASIX) 40 MG tablet, Take 40 mg by mouth daily., Disp: , Rfl:    ipratropium (ATROVENT) 0.06 % nasal spray, Place 1 spray into both nostrils as needed for rhinitis., Disp: , Rfl:    loratadine (CLARITIN) 10 MG tablet, Take 10 mg by mouth daily as needed., Disp: , Rfl:    losartan (COZAAR) 100 MG tablet, Take 50 mg by mouth daily., Disp: , Rfl:    meclizine (ANTIVERT) 25 MG tablet, Take 25 mg by mouth daily., Disp: , Rfl:    metFORMIN (GLUCOPHAGE) 1000 MG tablet, Take 1,000 mg by mouth 2 (two) times daily with a meal.,  Disp: , Rfl:    Multiple Vitamins-Minerals (MULTIVITAMIN WITH MINERALS) tablet, Take 1 tablet by mouth daily., Disp: , Rfl:    Melatonin 1 MG CAPS, Take by mouth., Disp: , Rfl:   Orders Placed This Encounter  Procedures   PCV MYOCARDIAL PERFUSION WITH LEXISCAN   EKG 12-Lead    There are no Patient Instructions on file for this visit.   --Continue cardiac medications as reconciled in final medication list. --Return in about 8 weeks (around 11/24/2022) for Follow up, Dyspnea, Review test results. or sooner if needed. --Continue follow-up with your primary care physician regarding the management of your other chronic comorbid conditions.  Patient's questions and concerns were addressed to her satisfaction. She voices understanding of the instructions provided during this  encounter.   This note was created using a voice recognition software as a result there may be grammatical errors inadvertently enclosed that do not reflect the nature of this encounter. Every attempt is made to correct such errors.  Tessa Lerner, [place], [facility]  Pager:  [phone] Office: [phone]

## 2022-10-06 ENCOUNTER — Ambulatory Visit: Payer: Medicare PPO

## 2022-10-06 DIAGNOSIS — R0609 Other forms of dyspnea: Secondary | ICD-10-CM

## 2022-10-06 DIAGNOSIS — I480 Paroxysmal atrial fibrillation: Secondary | ICD-10-CM

## 2022-10-09 NOTE — Progress Notes (Signed)
Called and spoke with patient regarding her Nuclear stress test results. She will continue to think onit and they will more than likely have an answer for you at the next office visit.

## 2022-12-04 ENCOUNTER — Ambulatory Visit: Payer: Medicare PPO | Admitting: Cardiology

## 2022-12-08 ENCOUNTER — Ambulatory Visit: Payer: Medicare PPO | Admitting: Cardiology

## 2022-12-08 ENCOUNTER — Encounter: Payer: Self-pay | Admitting: Cardiology

## 2022-12-08 VITALS — BP 123/63 | HR 82 | Resp 16 | Ht 62.0 in | Wt 211.6 lb

## 2022-12-08 DIAGNOSIS — Z7901 Long term (current) use of anticoagulants: Secondary | ICD-10-CM

## 2022-12-08 DIAGNOSIS — I10 Essential (primary) hypertension: Secondary | ICD-10-CM

## 2022-12-08 DIAGNOSIS — E119 Type 2 diabetes mellitus without complications: Secondary | ICD-10-CM

## 2022-12-08 DIAGNOSIS — E782 Mixed hyperlipidemia: Secondary | ICD-10-CM

## 2022-12-08 DIAGNOSIS — I480 Paroxysmal atrial fibrillation: Secondary | ICD-10-CM

## 2022-12-08 DIAGNOSIS — R0609 Other forms of dyspnea: Secondary | ICD-10-CM

## 2022-12-08 NOTE — Progress Notes (Signed)
ID:  KRIS BURD, DOB [birth year], MRN [id number]  PCP:  Kaleen Mask, MD  Cardiologist:  Tessa Lerner, DO, [facility] (established care 08/14/2022) Former Cardiology Providers: none  Date: 12/08/22 Last Office Visit: 09/29/2022  Chief Complaint  Patient presents with   PAF (paroxysmal atrial fibrillation) (HCC)    HPI  Carolyn Irwin is a 77 y.o.  female whose past medical history and cardiovascular risk factors include: Paroxysmal atrial fibrillation, hypertension, hyperlipidemia, and diabetes mellitus  Patient is being followed by the practice given her history of paroxysmal atrial fibrillation.  She is accompanied by her daughter at today's office visit and she provides verbal consent to have her present during the encounter.  In the recent past she was experiencing lightheaded/dizziness and had a cardiac monitor with PCP which noted incidental finding of atrial fibrillation.  Was able to obtain a formal report which notes A-fib burden to be approximately 1% with a total time of 30 minutes during the monitoring period.  We discussed the risks, benefits, alternatives to anticoagulation for thromboembolic prophylaxis as well as further monitoring for A-fib burden with either implantable loop recorder or FDA approved smart watch technology.  In the past that shared decision was to initiate anticoagulation for thromboembolic prophylaxis given her high CHA2DS2-VASc score and concerns for possible stroke.  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  Since last office visit she has invested into an Office manager data reviewed on her phone and the documented EKGs that are present illustrates sinus rhythm.  She is still hesitant to proceed with loop recorder implant.  FUNCTIONAL STATUS: No structured exercise program or daily routine.  ALLERGIES: No Known Allergies  MEDICATION LIST PRIOR TO VISIT: Current Meds  Medication Sig   allopurinol (ZYLOPRIM) 100 MG tablet Take 100 mg by mouth daily.    atorvastatin (LIPITOR) 80 MG tablet Take 80 mg by mouth daily.   Calcium Acetate, Phos Binder, (CALCIUM ACETATE PO) Take by mouth.   diltiazem (CARDIZEM CD) 120 MG 24 hr capsule Take 120 mg by mouth daily.   furosemide (LASIX) 40 MG tablet Take 40 mg by mouth daily.   ipratropium (ATROVENT) 0.06 % nasal spray Place 1 spray into both nostrils as needed for rhinitis.   loratadine (CLARITIN) 10 MG tablet Take 10 mg by mouth daily as needed.   losartan (COZAAR) 100 MG tablet Take 50 mg by mouth daily.   meclizine (ANTIVERT) 25 MG tablet Take 25 mg by mouth daily.   Melatonin 1 MG CAPS Take by mouth.   metFORMIN (GLUCOPHAGE) 1000 MG tablet Take 1,000 mg by mouth 2 (two) times daily with a meal.   Multiple Vitamins-Minerals (MULTIVITAMIN WITH MINERALS) tablet Take 1 tablet by mouth daily.     PAST MEDICAL HISTORY: Past Medical History:  Diagnosis Date   Diabetes mellitus without complication (HCC)    Gout    Hyperlipidemia    Hypertension     PAST SURGICAL HISTORY: Past Surgical History:  Procedure Laterality Date   CATARACT EXTRACTION Bilateral    TUBAL LIGATION Bilateral     FAMILY HISTORY: The patient family history includes Parkinson's disease in her sister; Prostate cancer in her father.  SOCIAL HISTORY:  The patient  reports that she has never smoked. She has never used smokeless tobacco. She reports that she does not drink alcohol and does not use drugs.  REVIEW OF SYSTEMS: Review of Systems  Constitutional: Positive for weight loss (12 # since last visit).  Cardiovascular:  Positive for  dyspnea on exertion (chronic). Negative for chest pain, claudication, irregular heartbeat, leg swelling, near-syncope, orthopnea, palpitations, paroxysmal nocturnal dyspnea and syncope.  Respiratory:  Positive for shortness of breath (chronic).   Hematologic/Lymphatic: Negative for bleeding problem.  Musculoskeletal:  Negative for muscle cramps and myalgias.  Neurological:  Negative for  dizziness and light-headedness.    PHYSICAL EXAM:    12/08/2022    3:36 PM 09/29/2022    2:36 PM 08/14/2022    8:52 AM  Vitals with BMI  Height 5\' 2"  5\' 2"  5\' 2"   Weight 211 lbs 10 oz 223 lbs 10 oz 228 lbs 3 oz  BMI 38.69 40.89 41.73  Systolic 123 144 161  Diastolic 63 71 67  Pulse 82 97 95    Physical Exam  Constitutional: No distress.  Age appropriate, hemodynamically stable, ambulates with a cane  Neck: No JVD present.  Cardiovascular: Normal rate, regular rhythm, S1 normal, S2 normal, intact distal pulses and normal pulses. Exam reveals no gallop, no S3 and no S4.  No murmur heard. Pulmonary/Chest: Effort normal and breath sounds normal. No stridor. She has no wheezes. She has no rales.  Abdominal: Soft. Bowel sounds are normal. She exhibits no distension. There is no abdominal tenderness.  Abdominal obesity  Musculoskeletal:        General: No edema.     Cervical back: Neck supple.  Neurological: She is alert and oriented to person, place, and time. She has intact cranial nerves (2-12).  Skin: Skin is warm and dry.  Denies skin, poor nail hygiene   CARDIAC DATABASE: EKG: Sep 29, 2022: Sinus rhythm, 86 bpm, left atrial enlargement, poor R wave progression, without underlying injury pattern. December 08, 2022: Sinus rhythm, 71 bpm, LAE, poor R wave progression, without underlying injury pattern.  No significant change compared to 09/29/2022.  Echocardiogram: 08/07/2022:  Normal LV systolic function with visual EF 55-60%. Left ventricle cavity is normal in size. Normal left ventricular wall thickness. Normal global wall motion. Normal diastolic filling pattern, normal LAP. Calculated EF 57%. Structurally normal tricuspid valve with trace regurgitation. No evidence of pulmonary hypertension. No prior available for comparison.     Stress Testing: Regadenoson (with Mod Bruce protocol) Nuclear stress test 10/06/2022 Myocardial perfusion is normal. Overall LV systolic function is  normal without regional wall motion abnormalities. Stress LV EF: 86%. Low risk study. Nondiagnostic ECG stress. The heart rate response was consistent with Regadenoson. The blood pressure response was physiologic. No previous exam available for comparison.   Heart Catheterization: None  Cardiac Monitor  07/04/2022 - 07/28/2022 Will get entire report from PCP office  Day summaries provided notes paroxysmal Afib.    LABORATORY DATA: External Labs 07/24/2022 Glucose 124, BUN/Cr 16/1.22. EGFR 45. Na/K 141/4.2. Rest of the CMP normal H/H 13.6/39.8. MCV 89. Platelets 209 Mag 1.6 TSH 1.66  IMPRESSION:    ICD-10-CM   1. PAF (paroxysmal atrial fibrillation) (HCC)  I48.0 EKG 12-Lead    2. Long term (current) use of anticoagulants  Z79.01     3. DOE (dyspnea on exertion)  R06.09     4. Benign hypertension  I10     5. Mixed hyperlipidemia  E78.2     6. Non-insulin dependent type 2 diabetes mellitus (HCC)  E11.9     7. Class 2 severe obesity due to excess calories with serious comorbidity and body mass index (BMI) of 38.0 to 38.9 in adult [facility])  E66.01    Z68.38        RECOMMENDATIONS: Carolyn Irwin is a 77 y.o.  female whose past medical history and cardiac risk factors include: hypertension, hyperlipidemia, and diabetes mellitus  PAF (paroxysmal atrial fibrillation) (HCC) Long-term anticoagulation Noted incidentally on a cardiac monitor performed by PCP.  Per report A-fib burden of approximately 1% (total time 30 minutes). We discussed the role of implantable loop recorder for further monitoring of A-fib burden prior to initiation of anticoagulation for thromboembolic prophylaxis given the risks of bleeding with long-term anticoagulation. However, she remains hesitant with regards to loop recorder implant and rather be treated for A-fib knowing the risks, benefits, and alternatives to anticoagulation. She uses her iWatch to monitor for A-fib burden for now.  Recent tracings  reviewed with her.  Rate control: Diltiazem. Rhythm control: N/A. Thromboembolic prophylaxis: Eliquis [id number] score 5 (age, hypertension, diabetes, gender).   Patient and daughter asked my opinion with regards to Afib management.  I explained to them that  being in Afib for 30 minutes does place herself at an elevated risk for thromboembolic event given her CHA2DS2-VASc score even though her overall A-fib burden is only 1%.  If she would like to avoid long-term anticoagulation the next best option would be loop recorder implant.to monitor for A-fib burden longitudinally.  After the loop recorder implant I would continue OAC for the first 3 months to obtain data with regards to A-fib burden.  However if no A-fib was detected during the first 90 days she could safely discontinue anticoagulation and will continue to monitor her for A-fib (as long as the loop remains active) and based on Afib recurrence/burden anticoagulation can be restarted at a later date if needed.   However, for now she would like to continue her current atrial fibrillation management.  Of note, her husband was recently diagnosed with stroke as well.  Dyspnea on Exertion  Chronic-ongoing for years. Predominantly brought on by effort related activities. Appears euvolemic on physical examination. Echo March 2024: Preserved LVEF, normal diastolic function, no significant valvular heart disease. Pharmacological MPI: Low risk study. Office and home blood pressures are well-controlled. No significant dysrhythmias noted on recent outpatient cardiac monitor. Echo from March 2024: LVEF preserved, normal diastolic function, see report for additional details Stress test: Low risk Advised her to discuss noncardiac causes of shortness of breath with PCP.  Benign essential hypertension:  Home and office blood pressures are well-controlled. No changes warranted at this time.  Diabetes mellitus type 2,  non-insulin-dependent. Currently on atorvastatin, metformin. Currently managed by primary care provider.  Mixed hyperlipidemia: Currently on statin therapy.  Reemphasized importance of lipid management.    FINAL MEDICATION LIST END OF ENCOUNTER: No orders of the defined types were placed in this encounter.   There are no discontinued medications.   Current Outpatient Medications:    allopurinol (ZYLOPRIM) 100 MG tablet, Take 100 mg by mouth daily., Disp: , Rfl:    atorvastatin (LIPITOR) 80 MG tablet, Take 80 mg by mouth daily., Disp: , Rfl:    Calcium Acetate, Phos Binder, (CALCIUM ACETATE PO), Take by mouth., Disp: , Rfl:    diltiazem (CARDIZEM CD) 120 MG 24 hr capsule, Take 120 mg by mouth daily., Disp: , Rfl:    furosemide (LASIX) 40 MG tablet, Take 40 mg by mouth daily., Disp: , Rfl:    ipratropium (ATROVENT) 0.06 % nasal spray, Place 1 spray into both nostrils as needed for rhinitis., Disp: , Rfl:    loratadine (CLARITIN) 10 MG tablet, Take 10 mg by mouth daily as needed., Disp: ,  Rfl:    losartan (COZAAR) 100 MG tablet, Take 50 mg by mouth daily., Disp: , Rfl:    meclizine (ANTIVERT) 25 MG tablet, Take 25 mg by mouth daily., Disp: , Rfl:    Melatonin 1 MG CAPS, Take by mouth., Disp: , Rfl:    metFORMIN (GLUCOPHAGE) 1000 MG tablet, Take 1,000 mg by mouth 2 (two) times daily with a meal., Disp: , Rfl:    Multiple Vitamins-Minerals (MULTIVITAMIN WITH MINERALS) tablet, Take 1 tablet by mouth daily., Disp: , Rfl:    apixaban (ELIQUIS) 5 MG TABS tablet, Take 1 tablet (5 mg total) by mouth 2 (two) times daily., Disp: 180 tablet, Rfl: 0  Orders Placed This Encounter  Procedures   EKG 12-Lead    There are no Patient Instructions on file for this visit.   --Continue cardiac medications as reconciled in final medication list. --Return in about 6 months (around 06/10/2023) for Follow up, A. fib. or sooner if needed. --Continue follow-up with your primary care physician regarding the  management of your other chronic comorbid conditions.  Patient's questions and concerns were addressed to her satisfaction. She voices understanding of the instructions provided during this encounter.   This note was created using a voice recognition software as a result there may be grammatical errors inadvertently enclosed that do not reflect the nature of this encounter. Every attempt is made to correct such errors.  Tessa Lerner, [place], [facility]  Pager:  [phone] Office: [phone]

## 2023-06-11 ENCOUNTER — Ambulatory Visit: Payer: Self-pay | Admitting: Cardiology

## 2023-06-15 ENCOUNTER — Ambulatory Visit: Payer: Medicare PPO | Admitting: Cardiology
